# Patient Record
Sex: Female | Born: 1935 | Race: White | Hispanic: No | Marital: Single
Health system: Southern US, Community
[De-identification: ages and names within clinical notes are randomized; demographics above are authoritative.]

---

## 2003-11-02 ENCOUNTER — Other Ambulatory Visit: Payer: Self-pay

## 2005-01-11 ENCOUNTER — Ambulatory Visit: Payer: Self-pay | Admitting: Family Medicine

## 2007-02-17 ENCOUNTER — Ambulatory Visit: Payer: Self-pay

## 2007-06-25 ENCOUNTER — Ambulatory Visit: Payer: Self-pay | Admitting: Family Medicine

## 2008-02-06 ENCOUNTER — Ambulatory Visit: Payer: Self-pay

## 2008-07-07 ENCOUNTER — Ambulatory Visit: Payer: Self-pay | Admitting: Family Medicine

## 2010-01-02 ENCOUNTER — Ambulatory Visit: Payer: Self-pay | Admitting: Family Medicine

## 2010-08-14 ENCOUNTER — Ambulatory Visit: Payer: Self-pay | Admitting: Family Medicine

## 2010-08-24 ENCOUNTER — Ambulatory Visit: Payer: Self-pay | Admitting: Family Medicine

## 2011-08-08 ENCOUNTER — Ambulatory Visit: Payer: Self-pay | Admitting: Family Medicine

## 2011-10-29 ENCOUNTER — Ambulatory Visit: Payer: Self-pay | Admitting: Family Medicine

## 2011-12-06 ENCOUNTER — Ambulatory Visit: Payer: Self-pay | Admitting: Family Medicine

## 2011-12-16 ENCOUNTER — Ambulatory Visit: Payer: Self-pay | Admitting: Internal Medicine

## 2012-01-02 ENCOUNTER — Ambulatory Visit: Payer: Self-pay

## 2012-01-02 LAB — PROTIME-INR
INR: 2.4
Prothrombin Time: 26.3 secs — ABNORMAL HIGH (ref 11.5–14.7)

## 2012-01-05 ENCOUNTER — Emergency Department: Payer: Self-pay | Admitting: *Deleted

## 2012-01-29 ENCOUNTER — Emergency Department: Payer: Self-pay | Admitting: Emergency Medicine

## 2012-01-29 LAB — COMPREHENSIVE METABOLIC PANEL
Albumin: 3.2 g/dL — ABNORMAL LOW (ref 3.4–5.0)
Alkaline Phosphatase: 72 U/L (ref 50–136)
Anion Gap: 13 (ref 7–16)
Calcium, Total: 9.2 mg/dL (ref 8.5–10.1)
Chloride: 96 mmol/L — ABNORMAL LOW (ref 98–107)
Co2: 26 mmol/L (ref 21–32)
EGFR (African American): >60
EGFR (Non-African Amer.): >60
Osmolality: 273 (ref 275–301)
Potassium: 3.8 mmol/L (ref 3.5–5.1)
Sodium: 135 mmol/L — ABNORMAL LOW (ref 136–145)

## 2012-01-29 LAB — CBC
HCT: 34.6 % — ABNORMAL LOW (ref 35.0–47.0)
MCHC: 33.5 g/dL (ref 32.0–36.0)
MCV: 98 fL (ref 80–100)
Platelet: 400 10*3/uL (ref 150–440)
RDW: 14.6 % — ABNORMAL HIGH (ref 11.5–14.5)
WBC: 8.5 10*3/uL (ref 3.6–11.0)

## 2012-01-29 LAB — URINALYSIS, COMPLETE
Glucose,UR: NEGATIVE mg/dL (ref 0–75)
Hyaline Cast: 3
Ketone: NEGATIVE
Leukocyte Esterase: NEGATIVE
Nitrite: NEGATIVE
Ph: 7 (ref 4.5–8.0)
RBC,UR: <1 /HPF (ref 0–5)
Specific Gravity: 1.006 (ref 1.003–1.030)
Squamous Epithelial: NONE SEEN
WBC UR: 1 /HPF (ref 0–5)

## 2012-04-15 ENCOUNTER — Ambulatory Visit: Payer: Self-pay | Admitting: Oncology

## 2012-05-02 ENCOUNTER — Ambulatory Visit: Payer: Self-pay | Admitting: Oncology

## 2012-05-02 LAB — COMPREHENSIVE METABOLIC PANEL
Albumin: 4.2 g/dL (ref 3.4–5.0)
Alkaline Phosphatase: 138 U/L — ABNORMAL HIGH (ref 50–136)
Anion Gap: 11 (ref 7–16)
BUN: 29 mg/dL — ABNORMAL HIGH (ref 7–18)
Bilirubin,Total: 0.8 mg/dL (ref 0.2–1.0)
Chloride: 95 mmol/L — ABNORMAL LOW (ref 98–107)
Co2: 26 mmol/L (ref 21–32)
Creatinine: 0.92 mg/dL (ref 0.60–1.30)
EGFR (African American): >60
EGFR (Non-African Amer.): >60
Glucose: 119 mg/dL — ABNORMAL HIGH (ref 65–99)
Osmolality: 271 (ref 275–301)
Potassium: 3.6 mmol/L (ref 3.5–5.1)
Sodium: 132 mmol/L — ABNORMAL LOW (ref 136–145)

## 2012-05-02 LAB — CBC CANCER CENTER
Basophil #: 0.1 x10 3/mm (ref 0.0–0.1)
Eosinophil #: 0.1 x10 3/mm (ref 0.0–0.7)
Eosinophil %: 0.7 %
HCT: 38.6 % (ref 35.0–47.0)
HGB: 12.8 g/dL (ref 12.0–16.0)
Lymphocyte %: 22.2 %
MCHC: 33.2 g/dL (ref 32.0–36.0)
Neutrophil #: 5.8 x10 3/mm (ref 1.4–6.5)
Neutrophil %: 68.9 %
RBC: 4.12 10*6/uL (ref 3.80–5.20)
RDW: 12.5 % (ref 11.5–14.5)

## 2012-05-19 IMAGING — CT CT ABD-PELV W/ CM
1 of 3 series · 13 of 32 positions shown, 18 images · non-contrast
Comparison: none

REASON FOR EXAM: (1) lower abd pain; (2) lower abd pain
COMMENTS:

[Series 2: 3mm soft tissue · axial · 0.69mm/px · z∈[-432,-72]mm · 13 of 136 slices shown, 18 images]
[im 8/136  soft-tissue]
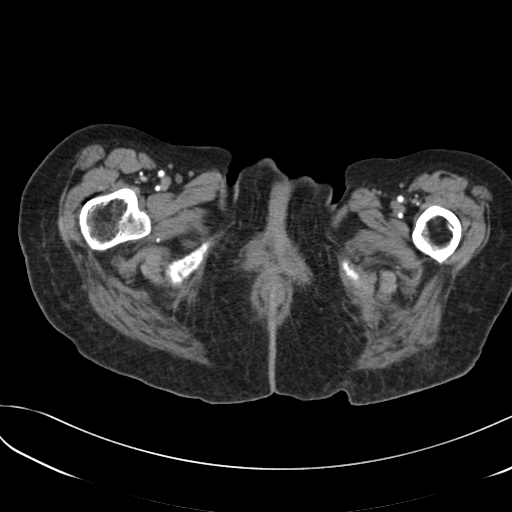
[im 8/136  bone]
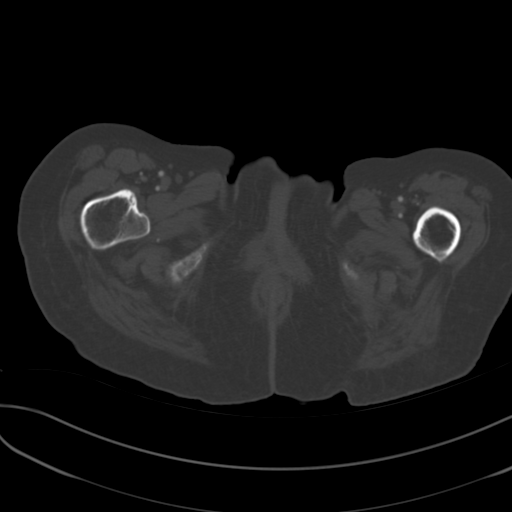
[im 22/136  soft-tissue]
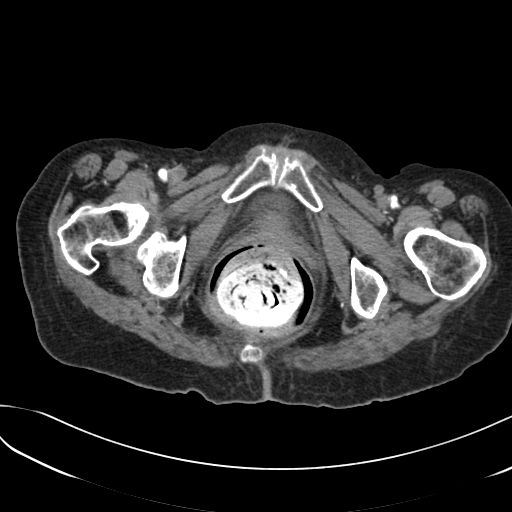
[im 29/136  soft-tissue]
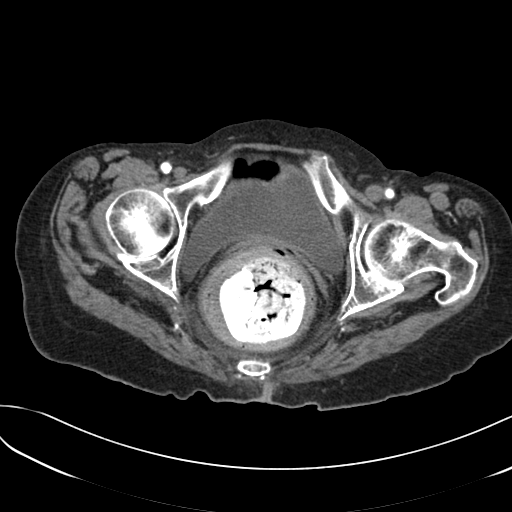
[im 43/136  soft-tissue]
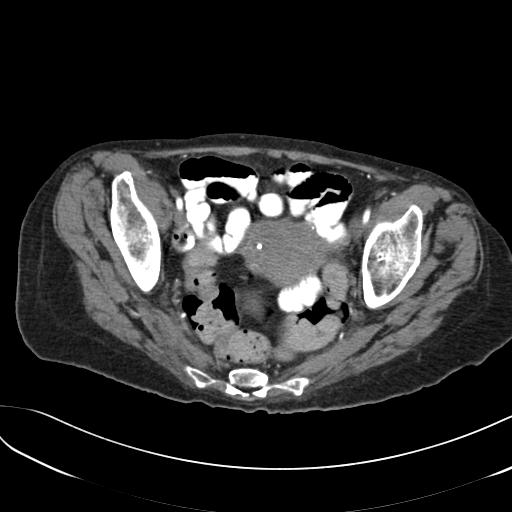
[im 50/136  soft-tissue]
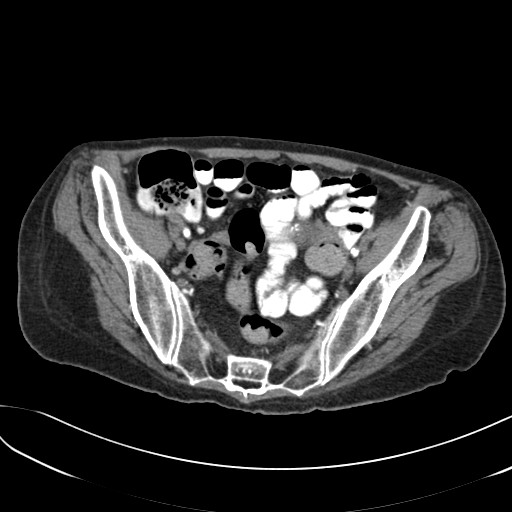
[im 64/136  soft-tissue]
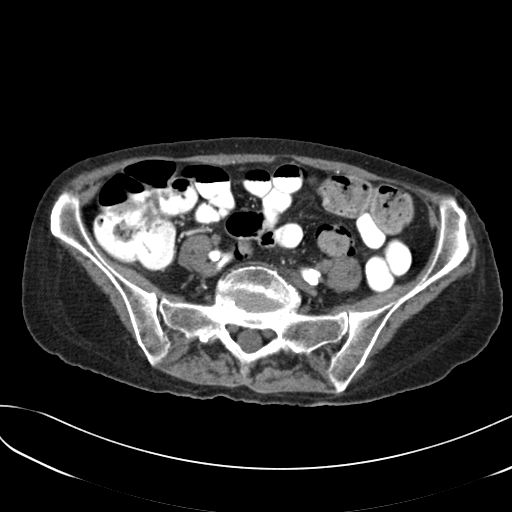
[im 72/136  soft-tissue]
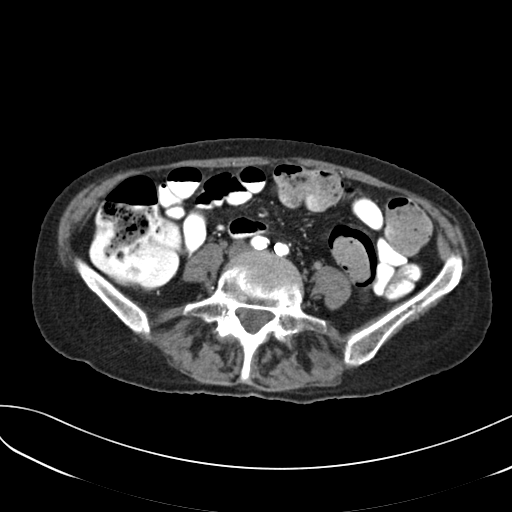
[im 86/136  soft-tissue]
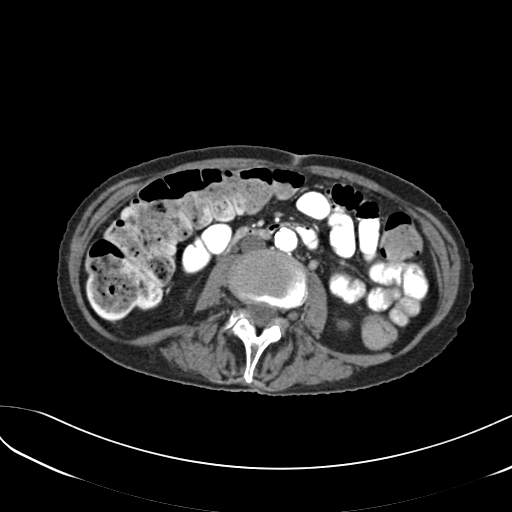
[im 93/136  soft-tissue]
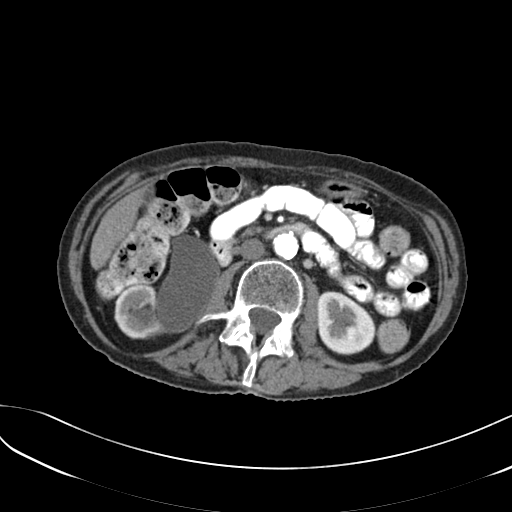
[im 93/136  bone]
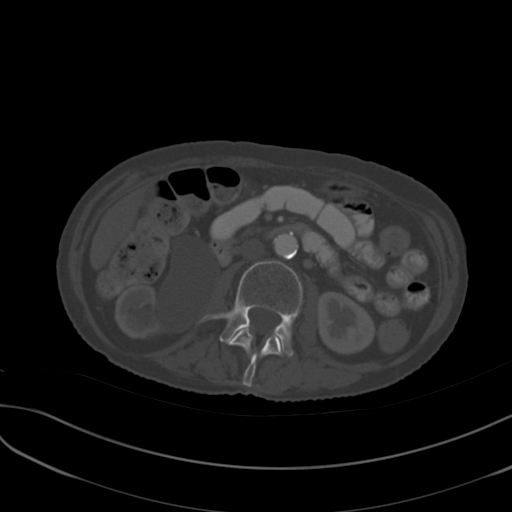
[im 107/136  soft-tissue]
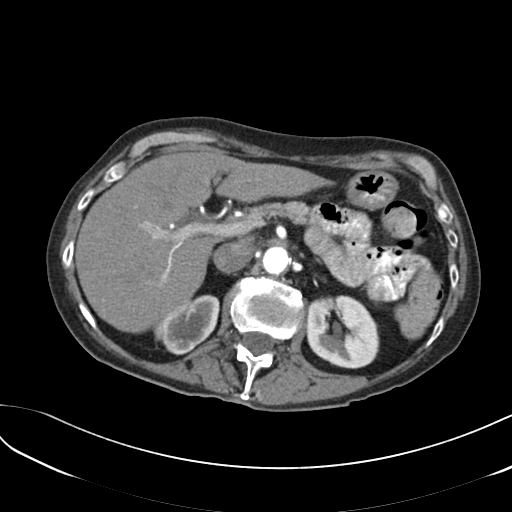
[im 107/136  lung]
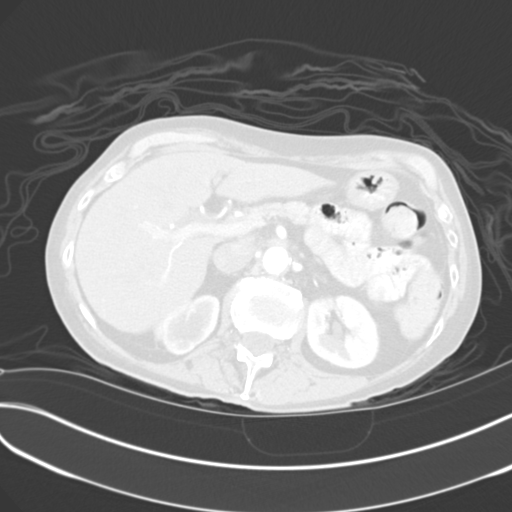
[im 114/136  soft-tissue]
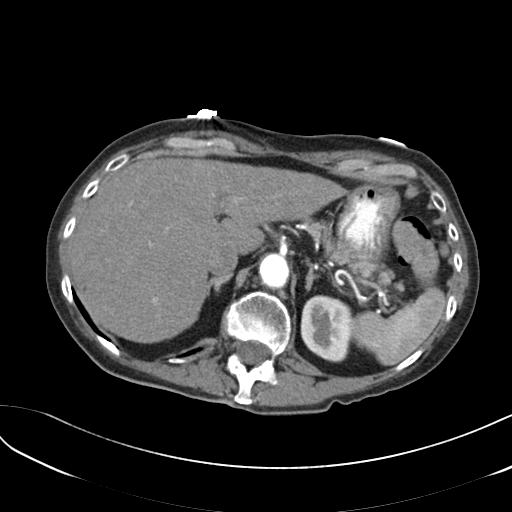
[im 114/136  lung]
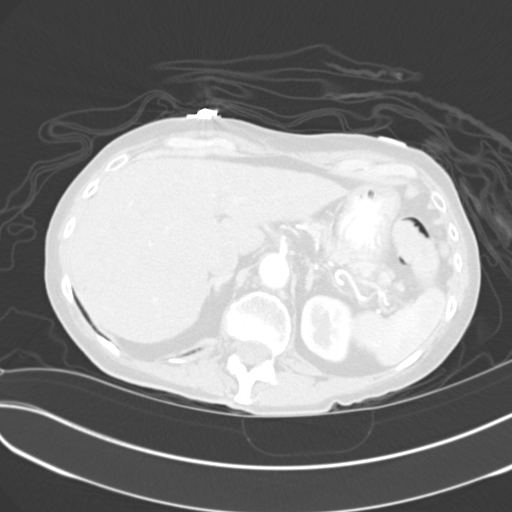
[im 121/136  lung]
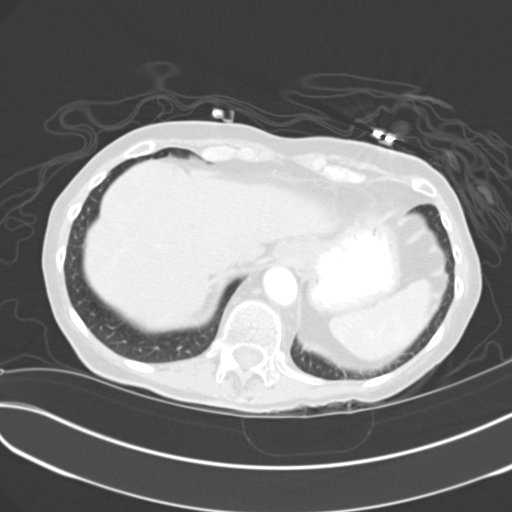
[im 128/136  soft-tissue]
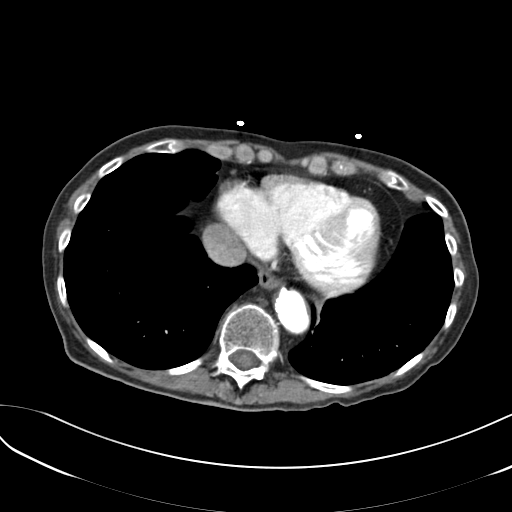
[im 128/136  lung]
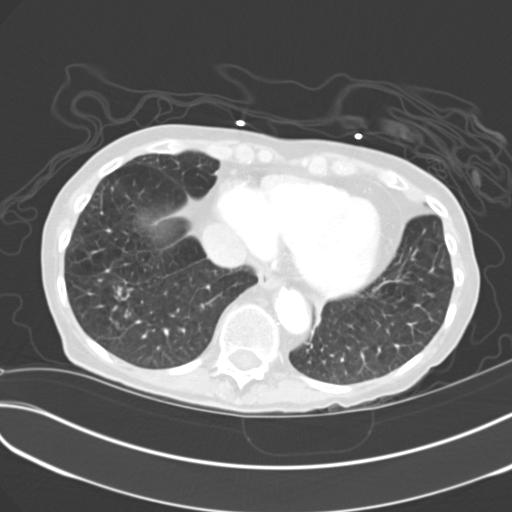

[13 of 32 positions shown; findings below may reference images not displayed]

PROCEDURE:     CT  - CT ABDOMEN / PELVIS  W  - January 29, 2012  [DATE]

RESULT:     Axial CT scanning was performed through the abdomen and pelvis
with reconstructions at 3 mm intervals and slice thicknesses. Review of
multiplanar reconstructed images was performed separately on the VIA
monitor. The patient received 85 cc of [id number] as well as oral contrast
material.

The uppermost images of the scan reveal the cardiac chambers to be quite
enlarged. There is no pleural nor pericardial effusion. The lung bases
exhibit no acute abnormality.

There are multiple gallstones layering in the dependent portion of the
gallbladder. I see no pericholecystic fluid. The liver, spleen, partially
distended stomach, pancreas, adrenal glands, and left kidney are normal in
appearance. There are parapelvic cysts on the right. There is prominence of
the right renal pelvis. Yet, I do not see evidence of hydroureter. The
partially distended urinary bladder is normal in appearance. There are
phleboliths within the pelvis. The uterus contains calcifications consistent
with fibroids. I see no adnexal masses. There is no free pelvic fluid.

The orally administered contrast has traversed the small bowel and reached
the distal ascending colon. There is a moderate amount of stool in the
ascending and transverse portions of the colon. There is a moderate amount
of stool in the sigmoid colon. The rectum is distended with radiodense
stool. There is no evidence of an inguinal nor umbilical hernia. The caliber
of the abdominal aorta is normal. The periaortic and pericaval regions are
normal in appearance. The lumbar vertebral bodies are preserved in height.
IMPRESSION: 1. There is a considerable amount of stool within the ascending and
transverse portions of the colon as well as some in the descending portion
of the colon. There is stool in the rectosigmoid which may reflect a fecal
impaction in the appropriate clinical setting.
2. I see no acute abnormality of the urinary bladder or uterus. There are
uterine fibroids present.
3. There is distention of the renal pelvis on the right without evidence of
hydroureter. There are likely parapelvic cysts on the right.
4. There are multiple gallstones without evidence of acute cholecystitis. I
see no evidence of other acute hepatobiliary abnormality.

## 2012-05-24 ENCOUNTER — Ambulatory Visit: Payer: Self-pay | Admitting: Oncology

## 2012-10-31 ENCOUNTER — Ambulatory Visit: Payer: Self-pay | Admitting: Oncology

## 2012-11-23 ENCOUNTER — Ambulatory Visit: Payer: Self-pay | Admitting: Oncology

## 2013-08-24 DEATH — deceased

## 2015-04-17 NOTE — Consult Note (Signed)
Note Type Consult   HPI: Referred by Self-referred.  Primary care by [facility].   This 81 year old Female patient presents to the clinic for initial evaluation of  Abnormal CT scan of the chest.  Subjective:  Chief Complaint/Diagnosis:   81 year old lady who had been chronic smoker in the past but quit smoking in 2004.  Patient was admitted in the hospital in December with progressive congestive heart failure atrial fibrillation.  Cardiomyopathy.  And shortness of breath and COPD been evaluation this was found to have left upper lobe mass.  Scan was done without contrast.  Patient then was sent home but because of weakness recent fell and was readmitted in the hospital followed by prolonged rehab therapy in [facility].  No patient's condition has gradually improved.  Shortness of breath and swelling is improved.  A repeat CT scan in April shows persistent left upper lobe mass with may be slightly increased in size.and   f [facility] is here for further evaluation and treatment consideration.no cough.  Shortness of breath on exertion.  Overall poor appetite.x-rays are done  in [facility]. hospital but available for my review.family complains that she has been having hoarseness of voice  Performance Status (ECOG): 2   Positive Symptoms: anorexia, weakness, cough, shortness of breath, Swelling of lower extremity   Negative Symptoms: fever, chills, nausea, vomiting, diarrhea, constipation, palpitations, burning with urination, urinary frequency, headache, numbness/tingling, back pain, hip pain, rash    Review of Systems:   General: weakness   Lungs: cough  SOB   Review of Systems: All other systems were reviewed and found to be negative   Review of Systems:   as per history of present illness  Allergies:  ASA: Other  Significant History/PMH:   Cataracts:    Atrial Fibrillation:    CHF:    COPD:   PFSH:  Comments: No family history of colorectal cancer, breast cancer,  or ovarian cancer.   Comments: Had smoked up to eat at 2002 but prior to that patient is 40 years history of smoking.  Does not drink.  Patient lives her cell.  Recently was in rehab therapy   Additional Past Medical and Surgical History: Biopsy of breast mass reported to be negative for malignancy.    Ischemic cardiomyopathy with ejection fraction is 20%.    COPD and emphysema   Home Medications: Medication Instructions Last Modified Date/Time  Advair Diskus 250 mcg-50 mcg inhalation powder 1 puff(s) inhaled 2 times a day 10-May-13 13:33  Altace 1.25 mg oral capsule 1 cap(s) orally every other day  10-May-13 13:33  Coreg 12.5 mg oral tablet 1 tab(s) orally 2 times a day 10-May-13 13:33  Coumadin 2-82m 1 dose(s) orally once a day 10-May-13 13:33  Lasix 80 mg oral tablet 1 tab(s) orally once a day 10-May-13 13:33  potassium chloride 20 mEq oral tablet, extended release 1 tab(s) orally once a day 10-May-13 13:33  Combivent 18 mcg-103 mcg-/inh inhalation aerosol 2 puff(s) inhaled once a day 10-May-13 13:33  Nasonex 50 mcg/inh nasal spray 2 spray(s) nasal prn 10-May-13 13:33  Dou-Neb treatment  10-May-13 13:33   Vital Signs:   :: Wt(KG): 49.5 Temp: 95.6 Pulse: 75 RR: 18 O2 Sat: 100  BP: 121/65   Physical Exam:   General: Patient is alert and oriented not in any acute distress.   Mental Status: alert and oriented to person, place and time.  Extremely apprehensive.   Head, Ears, Nose,Throat: normal, no  lesions or deformities hard of hearing.   Neck, Thyroid: no thyroid tenderness, enlargement or nodule.  neck supple without massess or tenderness. no adenopathy.  no carotid bruit.   Respiratory: Emphysematous chest.  Poor and entry on both sides.  Occasional rhonchi.   Cardiovascular: Irregular heart sounds.  Soft systolic murmur.   Breast: Examination is within normal limit   Gastrointestinal: ABDOMINAL:   Positive bowel sounds, nontender, nondistended, soft.  No hepatomegaly, no splenomegaly    Musculoskeletal: No abnormality detected   Skin: no rashes, ulcers, or lesions There is  sebaceous cyst on the back of the neck   Neurological: NEUROLOGICAL:   Strength 5/5.  Sensations grossly intact.  Alert and oriented x 3. Nonfocal examination.   Lymphatics: LYMPHATICS:   No cervical, axillary, or inguinal lymphadenopathy   Laboratory Results: Routine Chem:  10-May-13 14:27    eGFR (African American) >60   Lab Results Review:   Lab Results   CBC  :  02-May-2012 14:27:  Last Resulted Date/Time.8.4 Hgb: 12.8 Hct: 38.6 Plat: 251 MCV: 94 ANC: 5.8 . Metabolic Panel  :  [id number] 14:27:  Last Resulted Date/Time.132(L) K+: 3.6 CO2: 26 Chl: 95(L) BUN: 29(H) Cre: 0.92 Glu:119(H) Calcium: 9.4 Alk Phos: 138(H) ALT: 27 AST: 29 T.Prot: 8.6(H) Alb: 4.2 eGFR-AA: >60 eGFR: >60 .   Medical Imaging Results:    Review Medical Imaging   CT scan of the chest in January as well as from the April has been independently review compared and discussed with the patient.  There is left upper lobe mass there are a few hilar adenopathy.  Left upper lobe mass was some increased in size between April and January.of the records from [facility] has been reviewed  Assessment and Plan:  Impression:   1.abnormal CT scan with left upper lobe mass and hilar adenopathy on clinical ground is suggestive of malignancy. Ischemic cardiomyopathy congestive heart failure and atrial fibrillation Ejection fraction of 20% or less COPD had prolonged discussion (45 minutes or more) with patient and family.  The CT scans were done without contrast and sometimes it is very difficult to evaluate CT scan without contrast.  We discussed possibility of getting PET scan done in depending on the result possibility of bronchoscopy or needle biopsy is been discussed Other option would be to follow this with repeat CT scan in 4 months and if there is further growth , it may suggest malignancy.  Main question is whether after  diagnosis recent lab work to tolerate any chemoradiation therapy or not.  Patient does not want to impact the quality of her life.  After back in for discussion about lady is options which included not doing anything versus repeating CT scan in 4 months was has been PET scan family problem is going to off for PET scan but patient may decide not to pursue any further testing.  We have made arrangements for PET scan  but if patient would like to cancel it she will call radiology department directlycan tolerate bronchoscopy or needle biopsy but is not a candidate for any surgical options open lung biopsy that issue has been discussed with the patient.  CC Referral:   cc: [facility] in  [facility]   Tumor Staging:   Tumor Staging Tumor Staging    Tumor Staging Source Clinical    Tumor T1    Node N1    Metastasis M0    Stage III A   Electronic Signatures: Retia Cordle,  Martie Lee (MD)  (Signed 10-May-13 16:45)  Authored: Note Type, History of Present Illness, CC/HPI, Review of Systems, ALLERGIES, PAST MEDICAL HISTORY, Patient Family Social History, HOME MEDICATIONS, Vital Signs, Physical Exam, Lab Results Review, Rad Results Review, Assessment and Plan, CC Referring Physician, Quality Measures   Last Updated: 10-May-13 16:45 by Jobe Gibbon (MD)
